# Patient Record
Sex: Male | Born: 1980 | Race: White | Hispanic: No | Marital: Single | Smoking: Current every day smoker
Health system: Southern US, Community
[De-identification: ages and names within clinical notes are randomized; demographics above are authoritative.]

---

## 2001-07-05 ENCOUNTER — Inpatient Hospital Stay (HOSPITAL_COMMUNITY): Admission: EM | Admit: 2001-07-05 | Discharge: 2001-07-07 | Payer: Self-pay | Admitting: Emergency Medicine

## 2001-07-05 ENCOUNTER — Encounter: Payer: Self-pay | Admitting: Emergency Medicine

## 2003-02-05 ENCOUNTER — Emergency Department (HOSPITAL_COMMUNITY): Admission: EM | Admit: 2003-02-05 | Discharge: 2003-02-05 | Payer: Self-pay | Admitting: Emergency Medicine

## 2005-08-12 ENCOUNTER — Emergency Department (HOSPITAL_COMMUNITY): Admission: EM | Admit: 2005-08-12 | Discharge: 2005-08-12 | Payer: Self-pay | Admitting: Emergency Medicine

## 2005-08-16 ENCOUNTER — Emergency Department (HOSPITAL_COMMUNITY): Admission: EM | Admit: 2005-08-16 | Discharge: 2005-08-16 | Payer: Self-pay | Admitting: Family Medicine

## 2005-08-19 ENCOUNTER — Emergency Department (HOSPITAL_COMMUNITY): Admission: EM | Admit: 2005-08-19 | Discharge: 2005-08-19 | Payer: Self-pay | Admitting: Family Medicine

## 2005-08-20 ENCOUNTER — Emergency Department (HOSPITAL_COMMUNITY): Admission: AD | Admit: 2005-08-20 | Discharge: 2005-08-20 | Payer: Self-pay | Admitting: Family Medicine

## 2005-08-22 ENCOUNTER — Emergency Department (HOSPITAL_COMMUNITY): Admission: EM | Admit: 2005-08-22 | Discharge: 2005-08-22 | Payer: Self-pay | Admitting: Emergency Medicine

## 2007-08-20 ENCOUNTER — Emergency Department (HOSPITAL_COMMUNITY): Admission: EM | Admit: 2007-08-20 | Discharge: 2007-08-21 | Payer: Self-pay | Admitting: Emergency Medicine

## 2008-01-08 ENCOUNTER — Emergency Department (HOSPITAL_COMMUNITY): Admission: EM | Admit: 2008-01-08 | Discharge: 2008-01-08 | Payer: Self-pay | Admitting: Emergency Medicine

## 2010-01-29 ENCOUNTER — Emergency Department (HOSPITAL_COMMUNITY): Admission: EM | Admit: 2010-01-29 | Discharge: 2010-01-30 | Payer: Self-pay | Admitting: Emergency Medicine

## 2010-01-30 ENCOUNTER — Emergency Department (HOSPITAL_COMMUNITY): Admission: EM | Admit: 2010-01-30 | Discharge: 2010-01-30 | Payer: Self-pay | Admitting: Emergency Medicine

## 2011-03-01 ENCOUNTER — Inpatient Hospital Stay (INDEPENDENT_AMBULATORY_CARE_PROVIDER_SITE_OTHER)
Admission: RE | Admit: 2011-03-01 | Discharge: 2011-03-01 | Disposition: A | Discharge status: Home / Self Care | Payer: PRIVATE HEALTH INSURANCE | Source: Ambulatory Visit | Attending: Family Medicine | Admitting: Family Medicine

## 2011-03-01 DIAGNOSIS — J069 Acute upper respiratory infection, unspecified: Secondary | ICD-10-CM

## 2011-03-08 LAB — DIFFERENTIAL
Eosinophils Relative: 0 % (ref 0–5)
Lymphocytes Relative: 22 % (ref 12–46)
Lymphs Abs: 3.2 10*3/uL (ref 0.7–4.0)

## 2011-03-08 LAB — POCT CARDIAC MARKERS
Myoglobin, poc: 20.7 ng/mL (ref 12–200)
Troponin i, poc: <0.05 ng/mL (ref 0.00–0.09)

## 2011-03-08 LAB — D-DIMER, QUANTITATIVE: D-Dimer, Quant: <0.22 ug/mL-FEU (ref 0.00–0.48)

## 2011-03-08 LAB — CBC
HCT: 46.3 % (ref 39.0–52.0)
Platelets: 238 10*3/uL (ref 150–400)
WBC: 14.5 10*3/uL — ABNORMAL HIGH (ref 4.0–10.5)

## 2011-03-08 LAB — POCT I-STAT, CHEM 8
BUN: 8 mg/dL (ref 6–23)
Chloride: 106 mEq/L (ref 96–112)
Sodium: 140 mEq/L (ref 135–145)

## 2011-05-05 NOTE — H&P (Signed)
[facility]  Patient:    Joseph Jefferson, Joseph Jefferson                     MRN: [id number] Adm. Date:  [id number] Attending:  Benny Lennert                         History and Physical  CHIEF COMPLAINT:  Patient comes in with a chief complaint of abdominal pain.  HISTORY OF PRESENT ILLNESS:  This 30 year old male, with no significant past medical history, presented to the ER after one day of left lower quadrant abdominal pain, nausea and vomiting.  Patient states that he had been in decent health prior to his evaluation with the exception of some back pain that started one day prior.  His symptoms began approximately 10 a.m. and have persisted for the past 21 hours.  Patient has felt subjective fever and chills.  Patient denies any mitigating or exacerbating factors.  Patient has never had problems like this before.  PAST MEDICAL HISTORY:  None.  MEDICATIONS:  None.  ALLERGIES:  None.  FAMILY HISTORY:  Both parents are alive and well with no medical problems.  SOCIAL HISTORY:  Patient has a live-in girlfriend; they have two children. Patient does smoke approximately a pack of cigarettes per day and rarely drinks alcohol.  Patient denies any drugs.  REVIEW OF SYSTEMS:  Patient denies any headaches, visual changes.  Patient denies any chest pain or respiratory problems.  Patient has the above-mentioned abdominal pain, particularly in the left lower quadrant. Patient denies any bowel or bladder changes.  Patient denies any numbness or weakness.  PHYSICAL EXAMINATION:  VITAL SIGNS:  Temperature 101, pulse 109, blood pressure 98/51, respiratory rate 20.  GENERAL:  A young man in mild discomfort lying on a gurney.  HEENT:  Pupils are equal, round and reactive to light.  No papilledema.  TMs clear.  HEART:  Regular rhythm.  No murmurs, rubs, or gallops.  LUNGS:  Clear.  No rales.  No wheezes.  ABDOMEN:  Soft.  No rebound.  No guarding.  No  hepatosplenomegaly.  Positive bowel sounds.  Mild left lower quadrant tenderness.  EXTREMITIES:  No cyanosis, clubbing or edema.  LABORATORY DATA:  Comprehensive metabolic panel is within normal limits.  Chest x-ray normal.  CT of the abdomen showed trace pelvic fluid with question of minimal sigmoid diverticulosis with a normal appendix.  No abscesses.  WBC 16.4 with neutrophil of 84% and 12% monocytes.  Urinalysis is normal.  ASSESSMENT AND PLAN:  Abdominal pain and fever.  It is difficult to say what exactly is going on in [name] case.  Patient seems very young to have any diverticular disease, though there is some question of that on the CT scan.  Patient does not appear to have appendicitis on the CT scan; however, patient is still uncomfortable with some mild nausea and pain.  Patient was given a dose of Rocephin in the emergency room.  I will change this to Cipro and Flagyl.  It seems that patient should be able to tolerate p.o.s and will try him on a clear liquid diet for now.  If symptoms worsen or persist, then I think surgical evaluation will be warranted at that time, however, I think at this point the patient is stable.  This may simply be a very bad viral gastroenterology; however, given the white count with a left shift, it seems that there may  truly be a bacterial infectious cause.  I will follow closely. D:  07/05/01 TD:  07/05/01 Job: [id number]

## 2011-09-08 LAB — URINALYSIS, ROUTINE W REFLEX MICROSCOPIC
Bilirubin Urine: NEGATIVE
Glucose, UA: NEGATIVE
Hgb urine dipstick: NEGATIVE
Ketones, ur: NEGATIVE
Nitrite: NEGATIVE
Protein, ur: NEGATIVE
Specific Gravity, Urine: 1.013
Urobilinogen, UA: 0.2
pH: 6

## 2011-09-08 LAB — I-STAT 8, (EC8 V) (CONVERTED LAB)
Bicarbonate: 26.6 — ABNORMAL HIGH
Glucose, Bld: 89
Potassium: 3.8
TCO2: 28
pH, Ven: 7.372 — ABNORMAL HIGH

## 2011-09-08 LAB — DIFFERENTIAL
Basophils Absolute: 0
Basophils Relative: 0
Eosinophils Absolute: 0
Eosinophils Relative: 0
Lymphocytes Relative: 5 — ABNORMAL LOW
Lymphs Abs: 0.5 — ABNORMAL LOW
Monocytes Absolute: 1.3 — ABNORMAL HIGH
Monocytes Relative: 14 — ABNORMAL HIGH
Neutro Abs: 7.6
Neutrophils Relative %: 81 — ABNORMAL HIGH

## 2011-09-08 LAB — CBC
HCT: 47.5
Hemoglobin: 16.4
MCHC: 34.5
MCV: 94.7
Platelets: 183
RBC: 5.02
RDW: 12.8
WBC: 9.5

## 2011-09-08 LAB — POCT I-STAT CREATININE: Operator id: 146091

## 2011-09-08 LAB — INFLUENZA A+B VIRUS AG-DIRECT(RAPID)
Inflenza A Ag: POSITIVE — AB
Influenza B Ag: NEGATIVE

## 2011-09-08 LAB — CK: Total CK: 132

## 2012-01-22 ENCOUNTER — Encounter (HOSPITAL_COMMUNITY): Payer: Self-pay | Admitting: *Deleted

## 2012-01-22 ENCOUNTER — Emergency Department (HOSPITAL_COMMUNITY)
Admission: EM | Admit: 2012-01-22 | Discharge: 2012-01-23 | Disposition: A | Discharge status: Home / Self Care | Payer: Self-pay | Attending: Emergency Medicine | Admitting: Emergency Medicine

## 2012-01-22 ENCOUNTER — Emergency Department (HOSPITAL_COMMUNITY): Payer: Self-pay

## 2012-01-22 DIAGNOSIS — Z7729 Contact with and (suspected ) exposure to other hazardous substances: Secondary | ICD-10-CM | POA: Insufficient documentation

## 2012-01-22 DIAGNOSIS — R509 Fever, unspecified: Secondary | ICD-10-CM | POA: Insufficient documentation

## 2012-01-22 DIAGNOSIS — R0602 Shortness of breath: Secondary | ICD-10-CM | POA: Insufficient documentation

## 2012-01-22 DIAGNOSIS — R05 Cough: Secondary | ICD-10-CM

## 2012-01-22 DIAGNOSIS — R059 Cough, unspecified: Secondary | ICD-10-CM | POA: Insufficient documentation

## 2012-01-22 DIAGNOSIS — R079 Chest pain, unspecified: Secondary | ICD-10-CM | POA: Insufficient documentation

## 2012-01-22 MED ORDER — HYDROCODONE-HOMATROPINE 5-1.5 MG/5ML PO SYRP: 5.0000 mL | ORAL_SOLUTION | Freq: Four times a day (QID) | ORAL | Status: AC | PRN
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
## 2012-01-23 NOTE — ED Provider Notes (Signed)
Medical screening examination/treatment/procedure(s) were performed by non-physician practitioner and as supervising physician I was immediately available for consultation/collaboration.   Dayton Bailiff, MD 01/23/12 1450

## 2012-03-12 ENCOUNTER — Emergency Department (HOSPITAL_COMMUNITY)
Admission: EM | Admit: 2012-03-12 | Discharge: 2012-03-12 | Disposition: A | Discharge status: Home / Self Care | Payer: Self-pay | Attending: Emergency Medicine | Admitting: Emergency Medicine

## 2012-03-12 ENCOUNTER — Encounter (HOSPITAL_COMMUNITY): Payer: Self-pay | Admitting: *Deleted

## 2012-03-12 DIAGNOSIS — R197 Diarrhea, unspecified: Secondary | ICD-10-CM | POA: Insufficient documentation

## 2012-03-12 DIAGNOSIS — IMO0001 Reserved for inherently not codable concepts without codable children: Secondary | ICD-10-CM | POA: Insufficient documentation

## 2012-03-12 DIAGNOSIS — R112 Nausea with vomiting, unspecified: Secondary | ICD-10-CM | POA: Insufficient documentation

## 2012-03-12 DIAGNOSIS — R51 Headache: Secondary | ICD-10-CM | POA: Insufficient documentation

## 2012-03-12 DIAGNOSIS — J111 Influenza due to unidentified influenza virus with other respiratory manifestations: Secondary | ICD-10-CM | POA: Insufficient documentation

## 2012-03-12 DIAGNOSIS — R509 Fever, unspecified: Secondary | ICD-10-CM | POA: Insufficient documentation

## 2012-03-12 MED ORDER — KETOROLAC TROMETHAMINE 30 MG/ML IJ SOLN
30.0000 mg | Freq: Once | INTRAMUSCULAR | Status: AC
  Administered 2012-03-12: 30 mg via INTRAVENOUS
  Filled 2012-03-12: qty 1

## 2012-03-12 MED ORDER — SODIUM CHLORIDE 0.9 % IV BOLUS (SEPSIS)
1000.0000 mL | Freq: Once | INTRAVENOUS | Status: AC
  Administered 2012-03-12: 1000 mL via INTRAVENOUS

## 2012-03-12 MED ORDER — ONDANSETRON HCL 4 MG/2ML IJ SOLN
4.0000 mg | Freq: Once | INTRAMUSCULAR | Status: AC
  Administered 2012-03-12: 4 mg via INTRAVENOUS
  Filled 2012-03-12: qty 2

## 2012-03-12 MED ORDER — PROMETHAZINE HCL 25 MG PO TABS: 25.0000 mg | ORAL_TABLET | Freq: Four times a day (QID) | ORAL | Status: DC | PRN

## 2012-03-12 NOTE — Discharge Instructions (Signed)
Please try to stay hydrated.  She be given a prescription for Phenergan for nausea and vomiting.  Take this with a sip of water and weight 30-45 minutes before attempting to eat or drink.

## 2012-03-12 NOTE — ED Notes (Signed)
Pt states "started feeling bad on Saturday, have nausea, vomiting, diarrhea & hurting all over"

## 2012-03-12 NOTE — ED Provider Notes (Signed)
History     CSN: [id number]  Arrival date & time 03/12/12  1645   First MD Initiated Contact with Patient 03/12/12 1850      Chief Complaint  Patient presents with  . Generalized Body Aches  . Fever  . Nausea  . Emesis  . Diarrhea    (Consider location/radiation/quality/duration/timing/severity/associated sxs/prior treatment) HPI Comments: Tissue with 3 days of myalgias, fever, headache, nausea or vomiting, diarrhea.   Patient is a 31 y.o. male presenting with fever, vomiting, and diarrhea. The history is provided by the patient.  Fever Primary symptoms of the febrile illness include fever, fatigue, headaches, cough, nausea, vomiting, diarrhea and myalgias. Primary symptoms do not include wheezing, shortness of breath or rash. The current episode started 3 to 5 days ago. This is a new problem. The problem has not changed since onset. The headache is not associated with neck stiffness.  Emesis  Associated symptoms include cough, diarrhea, a fever, headaches and myalgias.  Diarrhea The primary symptoms include fever, fatigue, nausea, vomiting, diarrhea and myalgias. Primary symptoms do not include rash.    History reviewed. No pertinent past medical history.  History reviewed. No pertinent past surgical history.  No family history on file.  History  Substance Use Topics  . Smoking status: Former Smoker    Types: Cigarettes    Quit date: 02/13/2012  . Smokeless tobacco: Not on file  . Alcohol Use: Yes     rarely      Review of Systems  Constitutional: Positive for fever and fatigue.  HENT: Positive for rhinorrhea and neck pain. Negative for neck stiffness.   Respiratory: Positive for cough. Negative for shortness of breath and wheezing.   Gastrointestinal: Positive for nausea, vomiting and diarrhea.  Musculoskeletal: Positive for myalgias.  Skin: Negative for rash and wound.  Neurological: Positive for headaches.    Allergies  Review of patient's allergies  indicates no known allergies.  Home Medications   Current Outpatient Rx  Name Route Sig Dispense Refill  . ACETAMINOPHEN 500 MG PO TABS Oral Take 500 mg by mouth every 6 (six) hours as needed. pain    . DAYQUIL MULTI-SYMPTOM COLD/FLU PO Oral Take 2 capsules by mouth daily as needed. Cold symptons    . PROMETHAZINE HCL 25 MG PO TABS Oral Take 1 tablet (25 mg total) by mouth every 6 (six) hours as needed for nausea. 10 tablet 0    BP 117/79  Pulse 79  Temp(Src) 100 F (37.8 C) (Oral)  Resp 19  SpO2 97%  Physical Exam  Constitutional: He is oriented to person, place, and time. He appears well-developed and well-nourished.  Eyes: Pupils are equal, round, and reactive to light.  Neck: Normal range of motion.  Cardiovascular: Normal rate.   Pulmonary/Chest: Effort normal.  Abdominal: Soft. Bowel sounds are normal.  Musculoskeletal: Normal range of motion. He exhibits no tenderness.  Neurological: He is oriented to person, place, and time.  Skin: Skin is warm. No rash noted. There is pallor.    ED Course  Procedures (including critical care time)  Labs Reviewed - No data to display No results found.   1. Influenza       MDM  Patient feeling marginally better after IV fluids is able to tolerate by mouth.  Since his is the end of day 3 of his flulike syndrome.  I think it is too late to start antivirals encourage him to continue hydration by mouth as well as alternating doses of Tylenol and ibuprofen  and rest        Arman Filter, NP 03/13/12 1500

## 2012-03-13 NOTE — ED Provider Notes (Signed)
Medical screening examination/treatment/procedure(s) were performed by non-physician practitioner and as supervising physician I was immediately available for consultation/collaboration.  Sylvester Minton, MD 03/13/12 1548 

## 2014-01-13 ENCOUNTER — Encounter (HOSPITAL_COMMUNITY): Payer: Self-pay | Admitting: Emergency Medicine

## 2014-01-13 ENCOUNTER — Emergency Department (HOSPITAL_COMMUNITY)
Admission: EM | Admit: 2014-01-13 | Discharge: 2014-01-13 | Disposition: A | Discharge status: Home / Self Care | Payer: Self-pay | Attending: Emergency Medicine | Admitting: Emergency Medicine

## 2014-01-13 ENCOUNTER — Emergency Department (HOSPITAL_COMMUNITY): Payer: Self-pay

## 2014-01-13 DIAGNOSIS — Z791 Long term (current) use of non-steroidal anti-inflammatories (NSAID): Secondary | ICD-10-CM | POA: Insufficient documentation

## 2014-01-13 DIAGNOSIS — R197 Diarrhea, unspecified: Secondary | ICD-10-CM | POA: Insufficient documentation

## 2014-01-13 DIAGNOSIS — K5900 Constipation, unspecified: Secondary | ICD-10-CM | POA: Insufficient documentation

## 2014-01-13 DIAGNOSIS — J111 Influenza due to unidentified influenza virus with other respiratory manifestations: Secondary | ICD-10-CM | POA: Insufficient documentation

## 2014-01-13 LAB — COMPREHENSIVE METABOLIC PANEL
ALT: 16 U/L (ref 0–53)
AST: 20 U/L (ref 0–37)
Albumin: 4 g/dL (ref 3.5–5.2)
Alkaline Phosphatase: 82 U/L (ref 39–117)
BILIRUBIN TOTAL: 0.4 mg/dL (ref 0.3–1.2)
BUN: 9 mg/dL (ref 6–23)
CHLORIDE: 97 meq/L (ref 96–112)
CO2: 23 meq/L (ref 19–32)
CREATININE: 0.77 mg/dL (ref 0.50–1.35)
Calcium: 9.2 mg/dL (ref 8.4–10.5)
GLUCOSE: 123 mg/dL — AB (ref 70–99)
Potassium: 3.6 mEq/L — ABNORMAL LOW (ref 3.7–5.3)
Sodium: 136 mEq/L — ABNORMAL LOW (ref 137–147)
Total Protein: 7.7 g/dL (ref 6.0–8.3)

## 2014-01-13 LAB — CBC WITH DIFFERENTIAL/PLATELET
Basophils Absolute: 0 10*3/uL (ref 0.0–0.1)
Basophils Relative: 0 % (ref 0–1)
Eosinophils Absolute: 0 10*3/uL (ref 0.0–0.7)
Eosinophils Relative: 0 % (ref 0–5)
HEMATOCRIT: 45.7 % (ref 39.0–52.0)
HEMOGLOBIN: 16.5 g/dL (ref 13.0–17.0)
LYMPHS ABS: 1.3 10*3/uL (ref 0.7–4.0)
LYMPHS PCT: 9 % — AB (ref 12–46)
MCH: 32.2 pg (ref 26.0–34.0)
MCHC: 36.1 g/dL — ABNORMAL HIGH (ref 30.0–36.0)
MCV: 89.3 fL (ref 78.0–100.0)
MONO ABS: 1.6 10*3/uL — AB (ref 0.1–1.0)
MONOS PCT: 11 % (ref 3–12)
NEUTROS ABS: 11.6 10*3/uL — AB (ref 1.7–7.7)
Neutrophils Relative %: 81 % — ABNORMAL HIGH (ref 43–77)
Platelets: 160 10*3/uL (ref 150–400)
RBC: 5.12 MIL/uL (ref 4.22–5.81)
RDW: 12.9 % (ref 11.5–15.5)
WBC: 14.4 10*3/uL — AB (ref 4.0–10.5)

## 2014-01-13 LAB — LIPASE, BLOOD: LIPASE: 16 U/L (ref 11–59)

## 2014-01-13 MED ORDER — ONDANSETRON 4 MG PO TBDP: 4.0000 mg | ORAL_TABLET | Freq: Three times a day (TID) | ORAL | Status: DC | PRN

## 2014-01-13 MED ORDER — KETOROLAC TROMETHAMINE 30 MG/ML IJ SOLN
30.0000 mg | Freq: Once | INTRAMUSCULAR | Status: AC
  Administered 2014-01-13: 30 mg via INTRAVENOUS
  Filled 2014-01-13: qty 1

## 2014-01-13 MED ORDER — NAPROXEN 500 MG PO TABS: 500.0000 mg | ORAL_TABLET | Freq: Two times a day (BID) | ORAL | Status: DC

## 2014-01-13 MED ORDER — ONDANSETRON HCL 4 MG/2ML IJ SOLN
4.0000 mg | Freq: Once | INTRAMUSCULAR | Status: AC
  Administered 2014-01-13: 4 mg via INTRAVENOUS
  Filled 2014-01-13: qty 2

## 2014-01-13 MED ORDER — MORPHINE SULFATE 4 MG/ML IJ SOLN
4.0000 mg | INTRAMUSCULAR | Status: DC | PRN
  Administered 2014-01-13: 4 mg via INTRAVENOUS
  Filled 2014-01-13: qty 1

## 2014-01-13 MED ORDER — HYDROCOD POLST-CHLORPHEN POLST 10-8 MG/5ML PO LQCR: 5.0000 mL | Freq: Two times a day (BID) | ORAL | Status: DC

## 2014-01-13 MED ORDER — SODIUM CHLORIDE 0.9 % IV BOLUS (SEPSIS)
2000.0000 mL | Freq: Once | INTRAVENOUS | Status: AC
  Administered 2014-01-13: 2000 mL via INTRAVENOUS

## 2014-01-13 NOTE — Discharge Instructions (Signed)

## 2014-01-13 NOTE — ED Notes (Signed)
Fever; diarrhea; loss of appetite; headache; symptoms since Saturday; getting worse per family

## 2014-01-13 NOTE — ED Provider Notes (Signed)
CSN: [id number]     Arrival date & time 01/13/14  [id number] History   First MD Initiated Contact with Patient 01/13/14 [phone]     Chief Complaint  Patient presents with  . Fever  . Generalized Body Aches    HPI  Presents with a three-day illness. Symptoms started Friday night or Saturday. Bodyaches. Intermittent fevers up to 11. Headache. No neck pain or stiffness. No rash. Frequent cough. No dyspnea shortness of breath. Diarrhea starting this morning, however no nausea or vomiting. He states that he thought he got the diarrhea because he is drinking so much Gatorade. Diffuse bodyaches. No urinary symptoms. No dark urine.  History reviewed. No pertinent past medical history. History reviewed. No pertinent past surgical history. No family history on file. History  Substance Use Topics  . Smoking status: Former Smoker    Types: Cigarettes    Quit date: 02/13/2012  . Smokeless tobacco: Not on file  . Alcohol Use: Yes     Comment: rarely    Review of Systems  Constitutional: Positive for fever and chills. Negative for diaphoresis, appetite change and fatigue.  HENT: Negative for mouth sores, sore throat and trouble swallowing.   Eyes: Negative for visual disturbance.  Respiratory: Positive for cough. Negative for chest tightness, shortness of breath and wheezing.   Cardiovascular: Negative for chest pain.  Gastrointestinal: Positive for constipation. Negative for nausea, vomiting, abdominal pain, diarrhea and abdominal distention.  Endocrine: Negative for polydipsia, polyphagia and polyuria.  Genitourinary: Negative for dysuria, frequency and hematuria.  Musculoskeletal: Positive for myalgias. Negative for gait problem.  Skin: Negative for color change, pallor and rash.  Neurological: Negative for dizziness, syncope, light-headedness and headaches.  Hematological: Does not bruise/bleed easily.  Psychiatric/Behavioral: Negative for behavioral problems and confusion.    Allergies  Review  of patient's allergies indicates no known allergies.  Home Medications   Current Outpatient Rx  Name  Route  Sig  Dispense  Refill  . acetaminophen (TYLENOL) 500 MG tablet   Oral   Take 500 mg by mouth every 6 (six) hours as needed. pain         . aspirin-sod bicarb-citric acid (ALKA-SELTZER) 325 MG TBEF tablet   Oral   Take 325 mg by mouth every 6 (six) hours as needed.         Marland Kitchen. HYDROcodone-acetaminophen (NORCO/VICODIN) 5-325 MG per tablet   Oral   Take 1 tablet by mouth every 6 (six) hours as needed for moderate pain.         Marland Kitchen. ibuprofen (ADVIL,MOTRIN) 200 MG tablet   Oral   Take 400 mg by mouth every 6 (six) hours as needed.         . chlorpheniramine-HYDROcodone (TUSSIONEX PENNKINETIC ER) 10-8 MG/5ML LQCR   Oral   Take 5 mLs by mouth every 12 (twelve) hours.   60 mL   0   . naproxen (NAPROSYN) 500 MG tablet   Oral   Take 1 tablet (500 mg total) by mouth 2 (two) times daily.   30 tablet   0   . ondansetron (ZOFRAN ODT) 4 MG disintegrating tablet   Oral   Take 1 tablet (4 mg total) by mouth every 8 (eight) hours as needed for nausea.   6 tablet   0    BP 102/70  Pulse 110  Temp(Src) 98.7 F (37.1 C) (Oral)  Resp 18  SpO2 96% Physical Exam  Constitutional: He is oriented to person, place, and time. He appears well-developed and  well-nourished. No distress.  HENT:  Head: Normocephalic.  No pharyngitis. Normal TMs. No sinus findings. Nontender over the maxilla. No nasal congestion.  Eyes: Conjunctivae are normal. Pupils are equal, round, and reactive to light. No scleral icterus.  Neck: Normal range of motion. Neck supple. No thyromegaly present.  Cardiovascular: Normal rate and regular rhythm.  Exam reveals no gallop and no friction rub.   No murmur heard. Pulmonary/Chest: Effort normal. No respiratory distress.  Clear lungs. No focal diminished breath sounds. No increased work of breathing  Abdominal: Soft. Bowel sounds are normal. He exhibits no  distension. There is no tenderness. There is no rebound.  Musculoskeletal: Normal range of motion.  Neurological: He is alert and oriented to person, place, and time.  Skin: Skin is warm and dry. No rash noted.  Psychiatric: He has a normal mood and affect. His behavior is normal.    ED Course  Procedures (including critical care time) Labs Review Labs Reviewed  CBC WITH DIFFERENTIAL - Abnormal; Notable for the following:    WBC 14.4 (*)    MCHC 36.1 (*)    Neutrophils Relative % 81 (*)    Neutro Abs 11.6 (*)    Lymphocytes Relative 9 (*)    Monocytes Absolute 1.6 (*)    All other components within normal limits  COMPREHENSIVE METABOLIC PANEL - Abnormal; Notable for the following:    Sodium 136 (*)    Potassium 3.6 (*)    Glucose, Bld 123 (*)    All other components within normal limits  LIPASE, BLOOD   Imaging Review Dg Chest 2 View  01/13/2014   CLINICAL DATA:  Fever, cough.  EXAM: CHEST  2 VIEW  COMPARISON:  Chest radiograph 01/21/2013  FINDINGS: Stable cardiac and mediastinal contours. No consolidative pulmonary opacities. No pleural effusion or pneumothorax. Regional skeleton is unremarkable.  IMPRESSION: No acute cardiopulmonary process.   Electronically Signed   By: Annia Belt M.D.   On: 01/13/2014 12:20    EKG Interpretation   None       MDM   1. Influenza    Patient was feeling better after IV hydration, antiemetics, and pain medication.  X-ray showed no pneumonia. He is well oxygenated. He is afebrile. Is not tachycardic. Plan will be symptomatically at home. He is outside the window for antiviral therapy.    Rolland Porter, MD 01/13/14 [phone]

## 2014-01-13 NOTE — ED Notes (Signed)
Bed: WA18 Expected date:  Expected time:  Means of arrival:  Comments: EMS 

## 2014-11-10 ENCOUNTER — Emergency Department (HOSPITAL_COMMUNITY): Payer: Self-pay

## 2014-11-10 ENCOUNTER — Encounter (HOSPITAL_COMMUNITY): Payer: Self-pay

## 2014-11-10 ENCOUNTER — Emergency Department (HOSPITAL_COMMUNITY)
Admission: EM | Admit: 2014-11-10 | Discharge: 2014-11-10 | Disposition: A | Discharge status: Home / Self Care | Payer: Self-pay | Attending: Emergency Medicine | Admitting: Emergency Medicine

## 2014-11-10 DIAGNOSIS — R109 Unspecified abdominal pain: Secondary | ICD-10-CM

## 2014-11-10 DIAGNOSIS — N200 Calculus of kidney: Secondary | ICD-10-CM | POA: Insufficient documentation

## 2014-11-10 DIAGNOSIS — Z87891 Personal history of nicotine dependence: Secondary | ICD-10-CM | POA: Insufficient documentation

## 2014-11-10 DIAGNOSIS — Z79899 Other long term (current) drug therapy: Secondary | ICD-10-CM | POA: Insufficient documentation

## 2014-11-10 LAB — URINALYSIS, ROUTINE W REFLEX MICROSCOPIC
Bilirubin Urine: NEGATIVE
GLUCOSE, UA: NEGATIVE mg/dL
KETONES UR: 15 mg/dL — AB
LEUKOCYTES UA: NEGATIVE
Nitrite: NEGATIVE
Protein, ur: NEGATIVE mg/dL
Specific Gravity, Urine: 1.014 (ref 1.005–1.030)
UROBILINOGEN UA: 0.2 mg/dL (ref 0.0–1.0)
pH: 7 (ref 5.0–8.0)

## 2014-11-10 LAB — URINE MICROSCOPIC-ADD ON

## 2014-11-10 MED ORDER — OXYCODONE-ACETAMINOPHEN 5-325 MG PO TABS: 1.0000 | ORAL_TABLET | ORAL | Status: DC | PRN

## 2014-11-10 MED ORDER — PROMETHAZINE HCL 25 MG PO TABS: 25.0000 mg | ORAL_TABLET | Freq: Four times a day (QID) | ORAL | Status: DC | PRN

## 2014-11-10 MED ORDER — HYDROMORPHONE HCL 1 MG/ML IJ SOLN
1.0000 mg | Freq: Once | INTRAMUSCULAR | Status: AC
Start: 2014-11-10 — End: 2014-11-10
  Administered 2014-11-10: 1 mg via INTRAVENOUS
  Filled 2014-11-10: qty 1

## 2014-11-10 MED ORDER — KETOROLAC TROMETHAMINE 30 MG/ML IJ SOLN
30.0000 mg | Freq: Once | INTRAMUSCULAR | Status: AC
  Administered 2014-11-10: 30 mg via INTRAVENOUS
  Filled 2014-11-10: qty 1

## 2014-11-10 MED ORDER — ONDANSETRON HCL 4 MG/2ML IJ SOLN
4.0000 mg | Freq: Once | INTRAMUSCULAR | Status: AC
  Administered 2014-11-10: 4 mg via INTRAVENOUS
  Filled 2014-11-10: qty 2

## 2014-11-10 MED ORDER — IBUPROFEN 800 MG PO TABS: 800.0000 mg | ORAL_TABLET | Freq: Three times a day (TID) | ORAL | Status: DC

## 2014-11-10 NOTE — ED Notes (Signed)
Pt states he was at work.  Sudden onset rt lower quadrant abdominal pain radiating to back. HX of stones but does not feel same.  No n/v/d.  No prior illness to event.  No fever.  No change in urination.

## 2014-11-10 NOTE — ED Provider Notes (Signed)
CSN: [id number]     Arrival date & time 11/10/14  1630 History   First MD Initiated Contact with Patient 11/10/14 1647     Chief Complaint  Patient presents with  . Abdominal Pain     (Consider location/radiation/quality/duration/timing/severity/associated sxs/prior Treatment) HPI Comments: 33 year old male, history of kidney stone reports sudden onset of right flank pain that started 5-1/2 hours prior to arrival which is intermittent, sharp and stabbing, radiates to the right groin, not associated with dysuria hematuria or urethral discharge. He does have associated nausea, he denies any change with position, has no fevers chills, has no rashes, symptoms are severe at worst. No medications given prior to arrival.  Patient is a 33 y.o. male presenting with abdominal pain. The history is provided by the patient.  Abdominal Pain   History reviewed. No pertinent past medical history. History reviewed. No pertinent past surgical history. History reviewed. No pertinent family history. History  Substance Use Topics  . Smoking status: Former Smoker    Types: Cigarettes    Quit date: 02/13/2012  . Smokeless tobacco: Not on file  . Alcohol Use: Yes     Comment: rarely    Review of Systems  Gastrointestinal: Positive for abdominal pain.  All other systems reviewed and are negative.     Allergies  Poison ivy treatments  Home Medications   Prior to Admission medications   Medication Sig Start Date End Date Taking? Authorizing Provider  chlorpheniramine-HYDROcodone (TUSSIONEX PENNKINETIC ER) 10-8 MG/5ML LQCR Take 5 mLs by mouth every 12 (twelve) hours. Patient not taking: Reported on 11/10/2014 01/13/14   Rolland Porter, MD  ibuprofen (ADVIL,MOTRIN) 800 MG tablet Take 1 tablet (800 mg total) by mouth 3 (three) times daily. 11/10/14   Vida Roller, MD  naproxen (NAPROSYN) 500 MG tablet Take 1 tablet (500 mg total) by mouth 2 (two) times daily. Patient not taking: Reported on 11/10/2014  01/13/14   Rolland Porter, MD  naproxen (NAPROSYN) 500 MG tablet Take 1 tablet (500 mg total) by mouth 2 (two) times daily. Patient not taking: Reported on 11/10/2014 01/13/14   Rolland Porter, MD  ondansetron (ZOFRAN ODT) 4 MG disintegrating tablet Take 1 tablet (4 mg total) by mouth every 8 (eight) hours as needed for nausea. Patient not taking: Reported on 11/10/2014 01/13/14   Rolland Porter, MD  ondansetron (ZOFRAN ODT) 4 MG disintegrating tablet Take 1 tablet (4 mg total) by mouth every 8 (eight) hours as needed for nausea. Patient not taking: Reported on 11/10/2014 01/13/14   Rolland Porter, MD  oxyCODONE-acetaminophen (PERCOCET) 5-325 MG per tablet Take 1 tablet by mouth every 4 (four) hours as needed. 11/10/14   Vida Roller, MD  promethazine (PHENERGAN) 25 MG tablet Take 1 tablet (25 mg total) by mouth every 6 (six) hours as needed for nausea. 03/12/12 03/19/12  Arman Filter, NP  promethazine (PHENERGAN) 25 MG tablet Take 1 tablet (25 mg total) by mouth every 6 (six) hours as needed for nausea or vomiting. 11/10/14   Vida Roller, MD   BP 102/64 mmHg  Pulse 80  Temp(Src) 97.4 F (36.3 C) (Oral)  Resp 18  SpO2 100% Physical Exam  Constitutional: He appears well-developed and well-nourished.  HENT:  Head: Normocephalic and atraumatic.  Mouth/Throat: Oropharynx is clear and moist. No oropharyngeal exudate.  Eyes: Conjunctivae and EOM are normal. Pupils are equal, round, and reactive to light. Right eye exhibits no discharge. Left eye exhibits no discharge. No scleral icterus.  Neck: Normal range of  motion. Neck supple. No JVD present. No thyromegaly present.  Cardiovascular: Normal rate, regular rhythm, normal heart sounds and intact distal pulses.  Exam reveals no gallop and no friction rub.   No murmur heard. Pulmonary/Chest: Effort normal and breath sounds normal. No respiratory distress. He has no wheezes. He has no rales.  Abdominal: Soft. Bowel sounds are normal. He exhibits no distension and  no mass. There is no tenderness.  No abdominal tenderness to palpation  Musculoskeletal: Normal range of motion. He exhibits no edema or tenderness.  Right sided CVA tenderness,  Lymphadenopathy:    He has no cervical adenopathy.  Neurological: He is alert. Coordination normal.  Skin: Skin is warm and dry. No rash noted. No erythema.  Psychiatric: He has a normal mood and affect. His behavior is normal.  Nursing note and vitals reviewed.   ED Course  Procedures (including critical care time) Labs Review Labs Reviewed  URINALYSIS, ROUTINE W REFLEX MICROSCOPIC - Abnormal; Notable for the following:    Hgb urine dipstick MODERATE (*)    Ketones, ur 15 (*)    All other components within normal limits  URINE MICROSCOPIC-ADD ON    Imaging Review Ct Abdomen Pelvis Wo Contrast  11/10/2014   CLINICAL DATA:  Abdominal pain, right lower quadrant pain radiating to the back  EXAM: CT ABDOMEN AND PELVIS WITHOUT CONTRAST  TECHNIQUE: Multidetector CT imaging of the abdomen and pelvis was performed following the standard protocol without IV contrast.  COMPARISON:  None.  FINDINGS: Sagittal images of the spine shows bilateral pars defect at L5 level. Mild anterior spurring upper endplate of L3 and L4 vertebral body. Lung bases are unremarkable.  Unenhanced liver shows no biliary ductal dilatation. No calcified gallstones are noted within gallbladder.  Abdominal aorta is unremarkable.  Unenhanced pancreas spleen and adrenal glands are unremarkable. Unenhanced kidneys are symmetrical in size. No nephrolithiasis. No hydronephrosis or hydroureter. No calcified ureteral calculi are noted bilaterally. Multiple pelvic phleboliths.  There is a calcified calculus in left posterior aspect of urinary bladder measures 2 mm. Recent passed ureteral calculus cannot be excluded. Prostate gland and seminal vesicles are unremarkable.  Moderate stool noted in right colon and proximal transverse colon. No pericecal  inflammation. Normal appendix clearly visualized axial image 53. No small bowel obstruction. No ascites or free air. No adenopathy.  IMPRESSION: 1. No nephrolithiasis.  No hydronephrosis or hydroureter. 2. No calcified ureteral calculi. 3. There is 2 mm calcified calculus in left posterior aspect of urinary bladder. Recent passed ureteral calculus cannot be excluded. Clinical correlation is necessary. 4. Normal appendix. No pericecal inflammation. Moderate stool noted in right colon and proximal transverse colon. 5. No small bowel obstruction. 6. Bilateral pars defect at L5 level.   Electronically Signed   By: Natasha MeadLiviu  Pop M.D.   On: 11/10/2014 17:31      MDM   Final diagnoses:  Abdominal pain  Kidney stone    The patient has normal vital signs, he appears to have colicky-type pain consistent with kidney stone, bedside ultrasound shows no signs of hydronephrosis, CT scan pending, medications pending.  Emergency Focused Ultrasound Exam Limited retroperitoneal ultrasound of kidneys  Performed and interpreted by Dr. Hyacinth MeekerMiller Indication: flank pain Focused abdominal ultrasound with both kidneys imaged in transverse and longitudinal planes in real-time. Interpretation: No hydronephrosis visualized.   Images archived electronically  Urinalysis confirms hematuria, CT scan shows small kidney stone passed into the bladder, no residual stones or hydronephrosis, consistent with ultrasound. Patient has improved significantly after  medications given, stable for discharge.  Meds given in ED:  Medications  ketorolac (TORADOL) 30 MG/ML injection 30 mg (30 mg Intravenous Given 11/10/14 1703)  HYDROmorphone (DILAUDID) injection 1 mg (1 mg Intravenous Given 11/10/14 1703)  ondansetron (ZOFRAN) injection 4 mg (4 mg Intravenous Given 11/10/14 1703)    New Prescriptions   IBUPROFEN (ADVIL,MOTRIN) 800 MG TABLET    Take 1 tablet (800 mg total) by mouth 3 (three) times daily.   OXYCODONE-ACETAMINOPHEN  (PERCOCET) 5-325 MG PER TABLET    Take 1 tablet by mouth every 4 (four) hours as needed.   PROMETHAZINE (PHENERGAN) 25 MG TABLET    Take 1 tablet (25 mg total) by mouth every 6 (six) hours as needed for nausea or vomiting.      Vida RollerBrian D Jonuel Butterfield, MD 11/10/14 [phone]

## 2014-11-10 NOTE — Discharge Instructions (Signed)
Your exam and or your xrays have shown that you likely have a kidney stone.  You should follow up with the Urologist of your choosing or the Urologist listed above in the next 2-3 days if you have not passed the stone.  You should urinate in to the strainer until you pass the stone.    Flomax helps with passing the stone by opening up the Ureters (tubes), Vicodin and an antiinflammatory for pain if you are not allergic to these medicines.  Phenergan or Zofran for nausea.  Return to the ER for severe or worsening pain, vomiting or fevers or if you are unable to control your pain with the medicines provided.  Kidney Stones Kidney stones (ureteral lithiasis) are deposits that form inside your kidneys. The intense pain is caused by the stone moving through the urinary tract. When the stone moves, the ureter goes into spasm around the stone. The stone is usually passed in the urine.  CAUSES  A disorder that makes certain neck glands produce too much parathyroid hormone (primary hyperparathyroidism).  A buildup of uric acid crystals.  Narrowing (stricture) of the ureter.  A kidney obstruction present at birth (congenital obstruction).  Previous surgery on the kidney or ureters.  Numerous kidney infections.  SYMPTOMS  Feeling sick to your stomach (nauseous).  Throwing up (vomiting).  Blood in the urine (hematuria).  Pain that usually spreads (radiates) to the groin.  Frequency or urgency of urination.  DIAGNOSIS  Taking a history and physical exam.  Blood or urine tests.  Computerized X-ray scan (CT scan).  Occasionally, an examination of the inside of the urinary bladder (cystoscopy) is performed.  TREATMENT  Observation.  Increasing your fluid intake.  Surgery may be needed if you have severe pain or persistent obstruction.  The size, location, and chemical composition are all important variables that will determine the proper choice of action for you. Talk to your caregiver to better  understand your situation so that you will minimize the risk of injury to yourself and your kidney.  HOME CARE INSTRUCTIONS  Drink enough water and fluids to keep your urine clear or pale yellow.  Strain all urine through the provided strainer. Keep all particulate matter and stones for your caregiver to see. The stone causing the pain may be as small as a grain of salt. It is very important to use the strainer each and every time you pass your urine. The collection of your stone will allow your caregiver to analyze it and verify that a stone has actually passed.  Only take over-the-counter or prescription medicines for pain, discomfort, or fever as directed by your caregiver.  Make a follow-up appointment with your caregiver as directed.  Get follow-up X-rays if required. The absence of pain does not always mean that the stone has passed. It may have only stopped moving. If the urine remains completely obstructed, it can cause loss of kidney function or even complete destruction of the kidney. It is your responsibility to make sure X-rays and follow-ups are completed. Ultrasounds of the kidney can show blockages and the status of the kidney. Ultrasounds are not associated with any radiation and can be performed easily in a matter of minutes.  SEEK IMMEDIATE MEDICAL CARE IF:  Pain cannot be controlled with the prescribed medicine.  You have a fever.  The severity or intensity of pain increases over 18 hours and is not relieved by pain medicine.  You develop a new onset of abdominal pain.  You   feel faint or pass out.  MAKE SURE YOU:  Understand these instructions.  Will watch your condition.  Will get help right away if you are not doing well or get worse.  Document Released: 12/04/2005 Document Revised: 11/23/2011 Document Reviewed: 04/01/2010 [employer] Patient Information 2012 [employer].    

## 2015-08-23 ENCOUNTER — Encounter (HOSPITAL_COMMUNITY): Payer: Self-pay | Admitting: Vascular Surgery

## 2015-08-23 ENCOUNTER — Emergency Department (HOSPITAL_COMMUNITY)
Admission: EM | Admit: 2015-08-23 | Discharge: 2015-08-23 | Disposition: A | Discharge status: Home / Self Care | Payer: Self-pay | Attending: Emergency Medicine | Admitting: Emergency Medicine

## 2015-08-23 ENCOUNTER — Emergency Department (HOSPITAL_COMMUNITY): Payer: Self-pay

## 2015-08-23 DIAGNOSIS — Z87442 Personal history of urinary calculi: Secondary | ICD-10-CM | POA: Insufficient documentation

## 2015-08-23 DIAGNOSIS — Z79899 Other long term (current) drug therapy: Secondary | ICD-10-CM | POA: Insufficient documentation

## 2015-08-23 DIAGNOSIS — M5442 Lumbago with sciatica, left side: Secondary | ICD-10-CM | POA: Insufficient documentation

## 2015-08-23 DIAGNOSIS — Z720 Tobacco use: Secondary | ICD-10-CM | POA: Insufficient documentation

## 2015-08-23 MED ORDER — KETOROLAC TROMETHAMINE 60 MG/2ML IM SOLN
60.0000 mg | Freq: Once | INTRAMUSCULAR | Status: AC
  Administered 2015-08-23: 60 mg via INTRAMUSCULAR
  Filled 2015-08-23: qty 2

## 2015-08-23 MED ORDER — METHOCARBAMOL 500 MG PO TABS: 500.0000 mg | ORAL_TABLET | Freq: Two times a day (BID) | ORAL | Status: DC

## 2015-08-23 MED ORDER — NAPROXEN 500 MG PO TABS: 500.0000 mg | ORAL_TABLET | Freq: Two times a day (BID) | ORAL | Status: DC

## 2015-08-23 MED ORDER — DIAZEPAM 5 MG/ML IJ SOLN
5.0000 mg | Freq: Once | INTRAMUSCULAR | Status: AC
  Administered 2015-08-23: 5 mg via INTRAVENOUS
  Filled 2015-08-23: qty 2

## 2015-08-23 MED ORDER — HYDROCODONE-ACETAMINOPHEN 5-325 MG PO TABS: 2.0000 | ORAL_TABLET | ORAL | Status: DC | PRN

## 2015-08-23 NOTE — ED Notes (Signed)
Patient transported to X-ray 

## 2015-08-23 NOTE — ED Notes (Signed)
Pt reports to the ED for eval of mid to low back pain. The pain radiates into bilateral legs. The pain became very severe on Saturday. 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 Pt denies any numbness, tingling, paralysis, or bowel or bladder changes. Pt A&Ox4, resp e/u, and skin warm and dry.

## 2015-08-23 NOTE — ED Provider Notes (Signed)
CSN: [id number]     Arrival date & time 08/23/15  1615 History  This chart was scribed for Catha Gosselin, PA-C, working with Rolland Porter, MD by Chestine Spore, ED Scribe. The patient was seen in room TR11C/TR11C at 4:57 PM.    Chief Complaint  Patient presents with  . Back Pain      The history is provided by the patient. No language interpreter was used.    HPI Comments: Joseph Jefferson is a 34 y.o. male with a medical hx of kidney stones and scoliosis who presents to the Emergency Department complaining of gradual onset severe mid-low back pain onset 1 week ago worsening 3 days ago. Pt reports that his back pain is worsened with movement. Pt notes that he does a lot of heavy lifting for his job. He reports that the back pain does radiate to his bilateral legs that he describes as a shooting sensation. Pt states that this current pain does not feel like his kidney pain that he had in the past. Pt notes that when he bends over it feels like there is a bone moving. Pt has not been seen for his symptoms recently or been prescribed prednisone. Pt has not had a xray of his back since he was 34 years old due to a football injury where he was informed that he had scoliosis that he didn't follow up for. He states that he is having associated symptoms of low back pressure. He has tried heating pad and ibuprofen with no relief for his symptoms. Pt denies bowel/bladder incontinence or retention, hematuria, dysuria, numbness, tingling, weakness, and any other symptoms. Denies medical hx of CA or IV drug use.    History reviewed. No pertinent past medical history. No past surgical history on file. No family history on file. Social History  Substance Use Topics  . Smoking status: Current Every Day Smoker -- 0.50 packs/day    Types: Cigarettes    Last Attempt to Quit: 02/13/2012  . Smokeless tobacco: None  . Alcohol Use: Yes     Comment: rarely    Review of Systems  Constitutional: Negative for  fever, chills and diaphoresis.  Gastrointestinal:       No bowel incontinence  Genitourinary: Negative for dysuria and hematuria.       No bladder incontinence  Musculoskeletal: Positive for back pain.  Skin: Negative for color change, rash and wound.  Neurological: Negative for weakness and numbness.       No tingling      Allergies  Poison ivy treatments  Home Medications   Prior to Admission medications   Medication Sig Start Date End Date Taking? Authorizing Provider  chlorpheniramine-HYDROcodone (TUSSIONEX PENNKINETIC ER) 10-8 MG/5ML LQCR Take 5 mLs by mouth every 12 (twelve) hours. Patient not taking: Reported on 11/10/2014 01/13/14   Rolland Porter, MD  HYDROcodone-acetaminophen (NORCO/VICODIN) 5-325 MG per tablet Take 2 tablets by mouth every 4 (four) hours as needed. 08/23/15   Lilyonna Steidle Patel-Mills, PA-C  ibuprofen (ADVIL,MOTRIN) 800 MG tablet Take 1 tablet (800 mg total) by mouth 3 (three) times daily. 11/10/14   Eber Hong, MD  methocarbamol (ROBAXIN) 500 MG tablet Take 1 tablet (500 mg total) by mouth 2 (two) times daily. 08/23/15   Nobie Alleyne Patel-Mills, PA-C  naproxen (NAPROSYN) 500 MG tablet Take 1 tablet (500 mg total) by mouth 2 (two) times daily. 08/23/15   Telicia Hodgkiss Patel-Mills, PA-C  ondansetron (ZOFRAN ODT) 4 MG disintegrating tablet Take 1 tablet (4 mg total) by mouth every  8 (eight) hours as needed for nausea. Patient not taking: Reported on 11/10/2014 01/13/14   Rolland Porter, MD  ondansetron (ZOFRAN ODT) 4 MG disintegrating tablet Take 1 tablet (4 mg total) by mouth every 8 (eight) hours as needed for nausea. Patient not taking: Reported on 11/10/2014 01/13/14   Rolland Porter, MD  promethazine (PHENERGAN) 25 MG tablet Take 1 tablet (25 mg total) by mouth every 6 (six) hours as needed for nausea. 03/12/12 03/19/12  Earley Favor, NP  promethazine (PHENERGAN) 25 MG tablet Take 1 tablet (25 mg total) by mouth every 6 (six) hours as needed for nausea or vomiting. 11/10/14   Eber Hong, MD   BP  119/88 mmHg  Pulse 72  Temp(Src) 97.4 F (36.3 C) (Oral)  Resp 16  SpO2 96% Physical Exam  Constitutional: He is oriented to person, place, and time. He appears well-developed and well-nourished. No distress.  HENT:  Head: Normocephalic and atraumatic.  Eyes: EOM are normal.  Neck: Neck supple. No tracheal deviation present.  Cardiovascular: Normal rate.   Pulmonary/Chest: Effort normal. No respiratory distress.  Musculoskeletal: Normal range of motion.  Patient appears uncomfortable. Positive bilateral straight leg raise to 20. Tenderness to palpation of the lumbar paravertebral musculature but no midline tenderness. No CVA tenderness. No lower extremity weakness or numbness. No saddle anesthesia. Able to dorsi and plantar flex without difficulty. No foot drop.  Neurological: He is alert and oriented to person, place, and time.  Skin: Skin is warm and dry.  Psychiatric: He has a normal mood and affect. His behavior is normal.  Nursing note and vitals reviewed.   ED Course  Procedures (including critical care time) DIAGNOSTIC STUDIES: Oxygen Saturation is 96% on RA, nl by my interpretation.    COORDINATION OF CARE: 6:32 PM Discussed treatment plan with pt at bedside which includes imaging and pain medication and pt agreed to plan.    Labs Review Labs Reviewed - No data to display  Imaging Review Dg Lumbar Spine Complete  08/23/2015   CLINICAL DATA:  34 year old male with severe lower lumbar pain for several years. Pain extending down both legs.  EXAM: LUMBAR SPINE - COMPLETE 4+ VIEW  COMPARISON:  CT dated 11/10/2014  FINDINGS: There is no evidence of lumbar spine fracture. Alignment is normal. Intervertebral disc spaces are maintained.  IMPRESSION: Negative.   Electronically Signed   By: Elgie Collard M.D.   On: 08/23/2015 18:19     EKG Interpretation None      MDM   Final diagnoses:  Bilateral low back pain with left-sided sciatica  Patient presents for back pain  that is worse with movement. I do not suspect cauda equina syndrome or epidural abscess. His vital signs are stable. I believe this is most likely musculoskeletal related pain. I discussed return precautions with the patient and he verbally agrees with the plan. Medications  ketorolac (TORADOL) injection 60 mg (60 mg Intramuscular Given 08/23/15 1734)  diazepam (VALIUM) injection 5 mg (5 mg Intravenous Given 08/23/15 1734)   Rx: robaxin, percocet, naproxen  I personally performed the services described in this documentation, which was scribed in my presence. The recorded information has been reviewed and is accurate.    Catha Gosselin, PA-C 08/23/15 1837  Rolland Porter, MD 08/31/15 1721

## 2015-08-23 NOTE — Discharge Instructions (Signed)
Back Exercises Use the resource guide below to follow up with a primary care provider. Return for any bowel or bladder incontinence or retention, fever, weakness or numbness in your lower extremities. Back exercises help treat and prevent back injuries. The goal of back exercises is to increase the strength of your abdominal and back muscles and the flexibility of your back. These exercises should be started when you no longer have back pain. Back exercises include:  Pelvic Tilt. Lie on your back with your knees bent. Tilt your pelvis until the lower part of your back is against the floor. Hold this position 5 to 10 sec and repeat 5 to 10 times.  Knee to Chest. Pull first 1 knee up against your chest and hold for 20 to 30 seconds, repeat this with the other knee, and then both knees. This may be done with the other leg straight or bent, whichever feels better.  Sit-Ups or Curl-Ups. Bend your knees 90 degrees. Start with tilting your pelvis, and do a partial, slow sit-up, lifting your trunk only 30 to 45 degrees off the floor. Take at least 2 to 3 seconds for each sit-up. Do not do sit-ups with your knees out straight. If partial sit-ups are difficult, simply do the above but with only tightening your abdominal muscles and holding it as directed.  Hip-Lift. Lie on your back with your knees flexed 90 degrees. Push down with your feet and shoulders as you raise your hips a couple inches off the floor; hold for 10 seconds, repeat 5 to 10 times.  Back arches. Lie on your stomach, propping yourself up on bent elbows. Slowly press on your hands, causing an arch in your low back. Repeat 3 to 5 times. Any initial stiffness and discomfort should lessen with repetition over time.  Shoulder-Lifts. Lie face down with arms beside your body. Keep hips and torso pressed to floor as you slowly lift your head and shoulders off the floor. Do not overdo your exercises, especially in the beginning. Exercises may cause you  some mild back discomfort which lasts for a few minutes; however, if the pain is more severe, or lasts for more than 15 minutes, do not continue exercises until you see your caregiver. Improvement with exercise therapy for back problems is slow.  See your caregivers for assistance with developing a proper back exercise program. Document Released: 01/11/2005 Document Revised: 02/26/2012 Document Reviewed: 10/05/2011 [facility] Patient Information 2015 [place], [place]. This information is not intended to replace advice given to you by your health care provider. Make sure you discuss any questions you have with your health care provider.  Emergency Department Resource Guide 1) Find a Doctor and Pay Out of Pocket Although you won't have to find out who is covered by your insurance plan, it is a good idea to ask around and get recommendations. You will then need to call the office and see if the doctor you have chosen will accept you as a new patient and what types of options they offer for patients who are self-pay. Some doctors offer discounts or will set up payment plans for their patients who do not have insurance, but you will need to ask so you aren't surprised when you get to your appointment.  2) Contact Your Local Health Department Not all health departments have doctors that can see patients for sick visits, but many do, so it is worth a call to see if yours does. If you don't know where your local health  department is, you can check in your phone book. The CDC also has a tool to help you locate your state's health department, and many state websites also have listings of all of their local health departments.  3) Find a Walk-in Clinic If your illness is not likely to be very severe or complicated, you may want to try a walk in clinic. These are popping up all over the country in pharmacies, drugstores, and shopping centers. They're usually staffed by nurse practitioners or physician assistants that  have been trained to treat common illnesses and complaints. They're usually fairly quick and inexpensive. However, if you have serious medical issues or chronic medical problems, these are probably not your best option.  No Primary Care Doctor: - Call Health Connect at  [phone] - they can help you locate a primary care doctor that  accepts your insurance, provides certain services, etc. - Physician Referral Service- [phone]  Chronic Pain Problems: Organization         Address  Phone   Notes  [facility]  [phone] Patients need to be referred by their primary care doctor.   Medication Assistance: Organization         Address  Phone   Notes  [facility] Medication [facility] [address] [place] [place], [place] [address] [phone] --Must be a resident of [facility] -- Must have NO insurance coverage whatsoever (no Medicaid/ Medicare, etc.) -- The pt. MUST have a primary care doctor that directs their care regularly and follows them in the community   Med[facility]ist  [phone]   [employer]  [phone]    Agencies that provide inexpensive medical care: Organization         Address  Phone   Notes  [facility]  [phone]   [facility]    [phone]   [facility] [address] [place], [place] [address] [phone]   [facility] [address] [place] [place] [phone]   Planned Parenthood    [phone]   [facility]    [phone]   Community Health and [facility]  [address], [place] Phone:  [phone], Fax:  [phone] Hours of Operation:  9 am - 6 pm, M-F.  Also accepts Medicaid/Medicare and self-pay.  [facility]  [address], [place] Phone: [phone], Fax: [phone]. Hours of Operation:  8:30 am - 5:30 pm, M-F.  Also accepts Medicaid and  self-pay.  [facility] [place] [address] [place] Phone: [phone]   [facility] [address] Natasha Bence [place], [place] [phone], Ext. [phone] Mondays & Thursdays: 7-9 AM.  First 15 patients are seen on a first come, first serve basis.    Medicaid-accepting [facility] Providers:  Organization         Address  Phone   Notes  [facility] [address], [place] [phone] Also accepts self-pay patients.  [facility] [address] Laurell Josephs [place], [place]  [phone]   [facility] [address], [place] [phone]   [facility] [address] [place] [phone]   Renaye Rakers [address], [place]   [phone] Only accepts [place] Access [place] patients after they have  their name applied to their card.   Self-Pay (no insurance) in [facility]:  Organization         Address  Phone   Notes  Sickle Cell Patients, [facility] [place] [phone]   [facility] Urgent Care [place] [phone]   [facility] [place]  [place], [place], [place] [phone]   [facility]/Dr. Osei-Bonsu  [address], [place] or [address], [place] [phone] Phone number for both [place] and [place] locations is the same.  Urgent Medical and [facility] [address], [place] [phone]   [facility] [address] [place] or [address] [phone]   [facility] [address] [place] [phone], phone; [phone], fax Sees patients 1st and 3rd Saturday of every month.  Must not qualify for public or private insurance (i.e. Medicaid, Medicare, [place] Health Choice, Veterans' Benefits)  Household income  should be no more than 200% of the poverty level The clinic cannot treat you if you are pregnant or think you are pregnant  Sexually transmitted diseases are not treated at the clinic.    Dental Care: Organization         Address  Phone  Notes  [facility] Department of [facility] [place] [phone] Accepts children up to age 39 who are enrolled in [place] or [place]; pregnant women with a Medicaid card; and children who have applied for Medicaid or [place] Health Choice, but were declined, whose parents can pay a reduced fee at time of service.  [facility] Department of [facility]  [address], [place] [phone] Accepts children up to age 35 who are enrolled in [place] or [place]; pregnant women with a Medicaid card; and children who have applied for Medicaid or [place] Health Choice, but were declined, whose parents can pay a reduced fee at time of service.  [facility]  [place] [phone] Patients are seen by appointment only. Walk-ins are not accepted. [place] will see patients 24 years of age and older. Monday - Tuesday (8am-5pm) Most Wednesdays (8:30-5pm) $30 per visit, cash only  [facility] Adult Dental Access PROGRAM  [address], [facility] [phone] Patients are seen by appointment only. Walk-ins are not accepted. [place] will see patients 53 years of age and older. One Wednesday Evening (Monthly: Volunteer Based).  $30 per visit, cash only  [place]  [phone] for adults; Children under age 53, call Graduate Pediatric Dentistry at [phone]. Children aged 47-14, please call [phone] to request a pediatric application.  Dental services are provided in all areas of dental care including fillings, crowns and bridges, complete and partial dentures, implants, gum treatment,  root canals, and extractions. Preventive care is also provided. Treatment is provided to both adults and children. Patients are selected via a lottery and there is often a waiting list.   [facility] [address] [place]  [phone] [email or web address], [place], [place] [phone], Ext. [phone] Second and Fourth Thursday of each month, opens at 6:30 AM; Clinic ends at 9 AM.  Patients are seen on  a [employer] basis, and a limited number are seen during each clinic.   [facility]  [address] Hillard Danker [place], [place] [phone]   Eligibility Requirements You must have lived in [place], [place], or [place] counties for at least the last three months.   You cannot be eligible for state or federal sponsored [employer], including [employer], [place], or [employer].   You generally cannot be eligible for healthcare insurance through your employer.    How to apply: Eligibility screenings are held every Tuesday and Wednesday afternoon from 1:00 pm until 4:00 pm. You do not need an appointment for the interview!  [facility] [address] [place], [place]   [place]  [place] Department  [place]  [phone]    Behavioral Health Resources in the Community: Intensive Outpatient Programs Organization         Address  Phone  Notes  [place] [address] [place], [place] [phone]   [facility] [address], [place], [place]   ADS: Alcohol & Drug S[facility] [address], [place], [place]   [place] [address]  [place], [place] or [phone]   Substance Abuse Resources Organization         Address  Phone  Notes  Alcohol and Drug Services   [phone]   [place]  [phone]   [facility] [place]   Chinita Pester  [phone]   Residential & Outpatient Substance Abuse Program  [phone]   Psychological Services Organization         Address  Phone  Notes  [facility] [place]  [place]  [phone]   [place] [address] [place] or [phone]    Mobile Crisis Teams Organization         Address  Phone  Notes  [facility], Mobile Crisis Care Unit  [phone]   [facility]  [address] [place], [place]   [place] [address] [place] [phone]    Self-Help/Support Groups Organization         Address  Phone             Notes  [place] [facility] [place] - variety of support groups  [place] Call for more information  Narcotics Anonymous (NA), [facility] [address], [employer]  2 meetings at this location   Special educational needs teacher         Address  Phone  Notes  [facility] [place],    [place]  [phone]   [facility]  [address] [place] [phone], [place], [place]   [place], [place] [phone] Admissions: 8am-3pm M-F  Incentives Substance [place] [address],    [place], [place] [phone]   [facility] [address] Jadene Pierini [place], [place]   [facility] [address],  [place], [place]   [facility] Programs - Intensive Outpatient [address] Kristeen [address], [place], [place]   [facility] ([place].,  [place], [place] or [phone]   [facility] Services (RTS) [address], [place], [place]  Accepts Medicaid  [facility] [address],  [place] [phone] Substance Abuse/Addiction Treatment   [facility] Organization         Address  Phone  Notes  [facility]  [phone]   Angie Fava, PhD [address] Ervin Knack [place], [place]   [phone] or [phone]   [facility] Behavioral   [address] [place], [place] [phone]   [facility] [address] [place], [place] [phone] Insurance/Medicaid/sponsorship through [facility] and Families [address]                                    [place], [place] [phone] Therapy/tele-psych/case  [facility] [address] [place], [place] [phone]    Dr. Lolly Mustache  [phone]   [facility]  [employer] [facility] Dept. 1) [address], [place] 2) [address], [place] 3)  [address], [place] [phone]  [phone]   [facility] Child Abuse Hotline [phone] or [phone] (After Hours)

## 2016-06-13 ENCOUNTER — Emergency Department (HOSPITAL_COMMUNITY)
Admission: EM | Admit: 2016-06-13 | Discharge: 2016-06-13 | Disposition: A | Discharge status: Home / Self Care | Payer: 59 | Attending: Emergency Medicine | Admitting: Emergency Medicine

## 2016-06-13 ENCOUNTER — Emergency Department (HOSPITAL_COMMUNITY): Payer: 59

## 2016-06-13 ENCOUNTER — Encounter (HOSPITAL_COMMUNITY): Payer: Self-pay | Admitting: Emergency Medicine

## 2016-06-13 DIAGNOSIS — F17210 Nicotine dependence, cigarettes, uncomplicated: Secondary | ICD-10-CM | POA: Insufficient documentation

## 2016-06-13 DIAGNOSIS — R0789 Other chest pain: Secondary | ICD-10-CM | POA: Insufficient documentation

## 2016-06-13 DIAGNOSIS — R079 Chest pain, unspecified: Secondary | ICD-10-CM | POA: Diagnosis present

## 2016-06-13 DIAGNOSIS — Z7982 Long term (current) use of aspirin: Secondary | ICD-10-CM | POA: Insufficient documentation

## 2016-06-13 LAB — CBC
HEMATOCRIT: 41.1 % (ref 39.0–52.0)
HEMOGLOBIN: 15.1 g/dL (ref 13.0–17.0)
MCH: 31.6 pg (ref 26.0–34.0)
MCHC: 36.7 g/dL — AB (ref 30.0–36.0)
MCV: 86 fL (ref 78.0–100.0)
Platelets: 238 10*3/uL (ref 150–400)
RBC: 4.78 MIL/uL (ref 4.22–5.81)
RDW: 12.4 % (ref 11.5–15.5)
WBC: 7.2 10*3/uL (ref 4.0–10.5)

## 2016-06-13 LAB — BASIC METABOLIC PANEL
ANION GAP: 7 (ref 5–15)
BUN: 13 mg/dL (ref 6–20)
CHLORIDE: 105 mmol/L (ref 101–111)
CO2: 26 mmol/L (ref 22–32)
Calcium: 9.5 mg/dL (ref 8.9–10.3)
Creatinine, Ser: 0.72 mg/dL (ref 0.61–1.24)
GFR calc non Af Amer: >60 mL/min (ref >60)
Glucose, Bld: 108 mg/dL — ABNORMAL HIGH (ref 65–99)
Potassium: 3.7 mmol/L (ref 3.5–5.1)
Sodium: 138 mmol/L (ref 135–145)

## 2016-06-13 LAB — I-STAT TROPONIN, ED: TROPONIN I, POC: 0 ng/mL (ref 0.00–0.08)

## 2016-06-13 MED ORDER — KETOROLAC TROMETHAMINE 30 MG/ML IJ SOLN
30.0000 mg | Freq: Once | INTRAMUSCULAR | Status: AC
  Administered 2016-06-13: 30 mg via INTRAMUSCULAR
  Filled 2016-06-13: qty 1

## 2016-06-13 MED ORDER — NAPROXEN 500 MG PO TABS: 500.0000 mg | ORAL_TABLET | Freq: Two times a day (BID) | ORAL | Status: AC

## 2016-06-13 NOTE — ED Notes (Addendum)
Patient here with complaints of right sided chest pain non-radiating since Saturday. Increased with sneezing or coughing. Denies SOB or dizziness. Denies n/v/d.

## 2016-06-13 NOTE — Discharge Instructions (Signed)
There does not appear to be an emergent cause for your symptoms at this time. Your exam, labs, x-ray and EKG are all reassuring. Please take your medications as prescribed. Follow-up with your doctor or the community health and wellness Center to establish primary care for reevaluation. Return to ED for any new or worsening symptoms.  Chest Wall Pain Chest wall pain is pain in or around the bones and muscles of your chest. Sometimes, an injury causes this pain. Sometimes, the cause may not be known. This pain may take several weeks or longer to get better. HOME CARE INSTRUCTIONS  Pay attention to any changes in your symptoms. Take these actions to help with your pain:   Rest as told by your health care provider.   Avoid activities that cause pain. These include any activities that use your chest muscles or your abdominal and side muscles to lift heavy items.   If directed, apply ice to the painful area:  Put ice in a plastic bag.  Place a towel between your skin and the bag.  Leave the ice on for 20 minutes, 2-3 times per day.  Take over-the-counter and prescription medicines only as told by your health care provider.  Do not use tobacco products, including cigarettes, chewing tobacco, and e-cigarettes. If you need help quitting, ask your health care provider.  Keep all follow-up visits as told by your health care provider. This is important. SEEK MEDICAL CARE IF:  You have a fever.  Your chest pain becomes worse.  You have new symptoms. SEEK IMMEDIATE MEDICAL CARE IF:  You have nausea or vomiting.  You feel sweaty or light-headed.  You have a cough with phlegm (sputum) or you cough up blood.  You develop shortness of breath.   This information is not intended to replace advice given to you by your health care provider. Make sure you discuss any questions you have with your health care provider.   Document Released: 12/04/2005 Document Revised: 08/25/2015 Document  Reviewed: 03/01/2015 Elsevier Interactive Patient Education [employer]2016 Elsevier Inc.

## 2016-06-13 NOTE — ED Provider Notes (Signed)
CSN: [id number]     Arrival date & time 06/13/16  1257 History   First MD Initiated Contact with Patient 06/13/16 1351     Chief Complaint  Patient presents with  . Chest Pain     (Consider location/radiation/quality/duration/timing/severity/associated sxs/prior Treatment) HPI Joseph Jefferson is a 35 y.o. male with 30 pack year smoking history here for evaluation of sudden onset, right-sided chest pain started Saturday. Patient reports he sneezed really hard and experienced sudden right-sided chest discomfort that is sharp in nature. Worse when he breathes deeply, coughs, yawns or hiccups. He is tried ice and icy hot without relief of his symptoms. He has not tried any other medications. He denies any shortness of breath, nausea or vomiting, diaphoresis, leg swelling, hemoptysis. No other modifying factors. He does note that he quit smoking roughly 8 months ago and has been running since that time and has not experienced any chest discomfort.  History reviewed. No pertinent past medical history. History reviewed. No pertinent past surgical history. History reviewed. No pertinent family history. Social History  Substance Use Topics  . Smoking status: Current Every Day Smoker -- 0.50 packs/day    Types: Cigarettes    Last Attempt to Quit: 02/13/2012  . Smokeless tobacco: None  . Alcohol Use: Yes     Comment: rarely    Review of Systems A 10 point review of systems was completed and was negative except for pertinent positives and negatives as mentioned in the history of present illness     Allergies  Poison ivy treatments  Home Medications   Prior to Admission medications   Medication Sig Start Date End Date Taking? Authorizing Provider  Aspirin-Acetaminophen-Caffeine (GOODYS EXTRA STRENGTH) [id number] MG PACK Take 1 packet by mouth once.   Yes Historical Provider, MD  Multiple Vitamin (MULTIVITAMIN WITH MINERALS) TABS tablet Take 1 tablet by mouth daily.   Yes Historical  Provider, MD  naproxen (NAPROSYN) 500 MG tablet Take 1 tablet (500 mg total) by mouth 2 (two) times daily. 06/13/16   Joycie PeekBenjamin Alexa Golebiewski, PA-C   BP 127/93 mmHg  Pulse 92  Temp(Src) 98.5 F (36.9 C) (Oral)  Resp 18  SpO2 100% Physical Exam  Constitutional: He is oriented to person, place, and time. He appears well-developed and well-nourished.  HENT:  Head: Normocephalic and atraumatic.  Mouth/Throat: Oropharynx is clear and moist.  Eyes: Conjunctivae are normal. Pupils are equal, round, and reactive to light. Right eye exhibits no discharge. Left eye exhibits no discharge. No scleral icterus.  Neck: Neck supple.  Cardiovascular: Normal rate, regular rhythm and normal heart sounds.   Pulmonary/Chest: Effort normal and breath sounds normal. No respiratory distress. He has no wheezes. He has no rales. He exhibits tenderness.  Discomfort is exacerbated and replicated with palpation of the right anterior chest wall, posterior ribs and right axillary ribs. No crepitus, tenting, rash or other abnormal findings.  Abdominal: Soft. There is no tenderness.  Musculoskeletal: He exhibits no tenderness.  Neurological: He is alert and oriented to person, place, and time.  Cranial Nerves II-XII grossly intact  Skin: Skin is warm and dry. No rash noted.  Psychiatric: He has a normal mood and affect.  Nursing note and vitals reviewed.   ED Course  Procedures (including critical care time) Labs Review Labs Reviewed  BASIC METABOLIC PANEL - Abnormal; Notable for the following:    Glucose, Bld 108 (*)    All other components within normal limits  CBC - Abnormal; Notable for the following:  MCHC 36.7 (*)    All other components within normal limits  [place] SensorI-STAT TROPOININ, ED    Imaging Review Dg Chest 2 View  06/13/2016  CLINICAL DATA:  Right-sided chest pain for 3 days EXAM: CHEST  2 VIEW COMPARISON:  January 13, 2014 FINDINGS: There is no edema or consolidation. The heart size and pulmonary  vascularity are normal. No pneumothorax. No adenopathy. No bone lesions. IMPRESSION: No edema or consolidation. Electronically Signed   By: Bretta BangWilliam  Woodruff III M.D.   On: 06/13/2016 13:29   I have personally reviewed and evaluated these images and lab results as part of my medical decision-making.   EKG Interpretation   Date/Time:  Tuesday June 13 2016 13:04:15 EDT Ventricular Rate:  89 PR Interval:    QRS Duration: 90 QT Interval:  341 QTC Calculation: 415 R Axis:   64 Text Interpretation:  Sinus rhythm RSR' in V1 or V2, probably normal  variant ST elev, probable normal early repol pattern Confirmed by Lincoln Brighamees,  Liz [phone]([id number]) on 06/13/2016 1:07:42 PM Also confirmed by Lincoln Brighamees, Liz [phone]([id number]),  editor Dan HumphreysWALKER, CCT, SANDRA [phone]([id number])  on 06/13/2016 1:37:14 PM      MDM  Patient with atypical chest pain for ACS, most consistent with MSK etiology. Arrival, he is hemodynamically stable, afebrile and appears well. Discomfort is replicated with palpation of chest wall. Initial troponin, screening labs, chest x-ray are all reassuring. EKG also reassuring. Heart Score 2. PERC neg. Treated discomfort and emergency department with Toradol, will DC with naproxen and instructions to follow-up with PCP. Final diagnoses:  Right-sided chest wall pain        Joycie PeekBenjamin Zaineb Nowaczyk, PA-C 06/13/16 1557  Arby BarretteMarcy Pfeiffer, MD 06/17/16 1050

## 2016-12-19 IMAGING — CR DG CHEST 2V
2 series · 2 of 2 positions shown · non-contrast
Comparison: January 13, 2014

CLINICAL DATA: Right-sided chest pain for 3 days

EXAM:
CHEST  2 VIEW

[w chest pa]
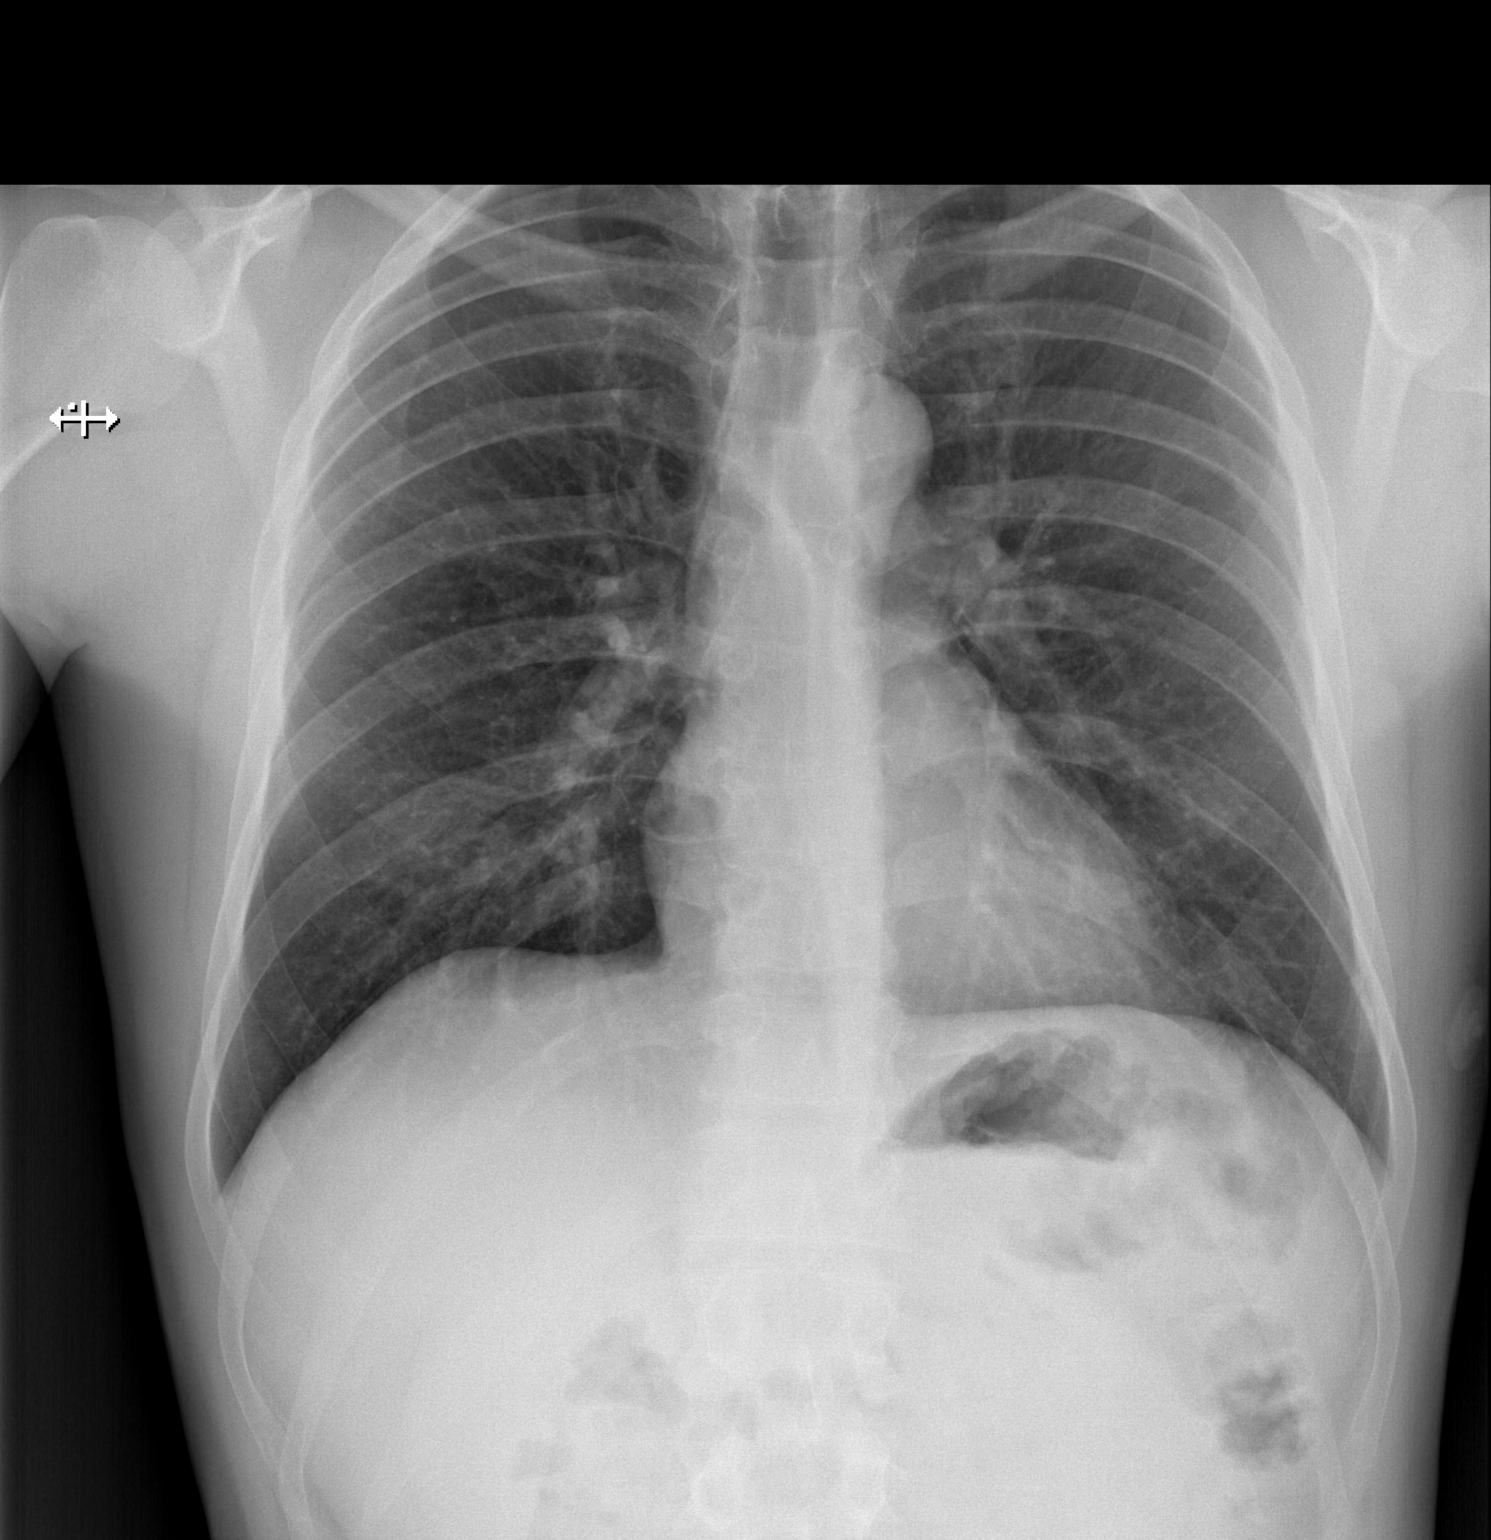

[w chest lat]
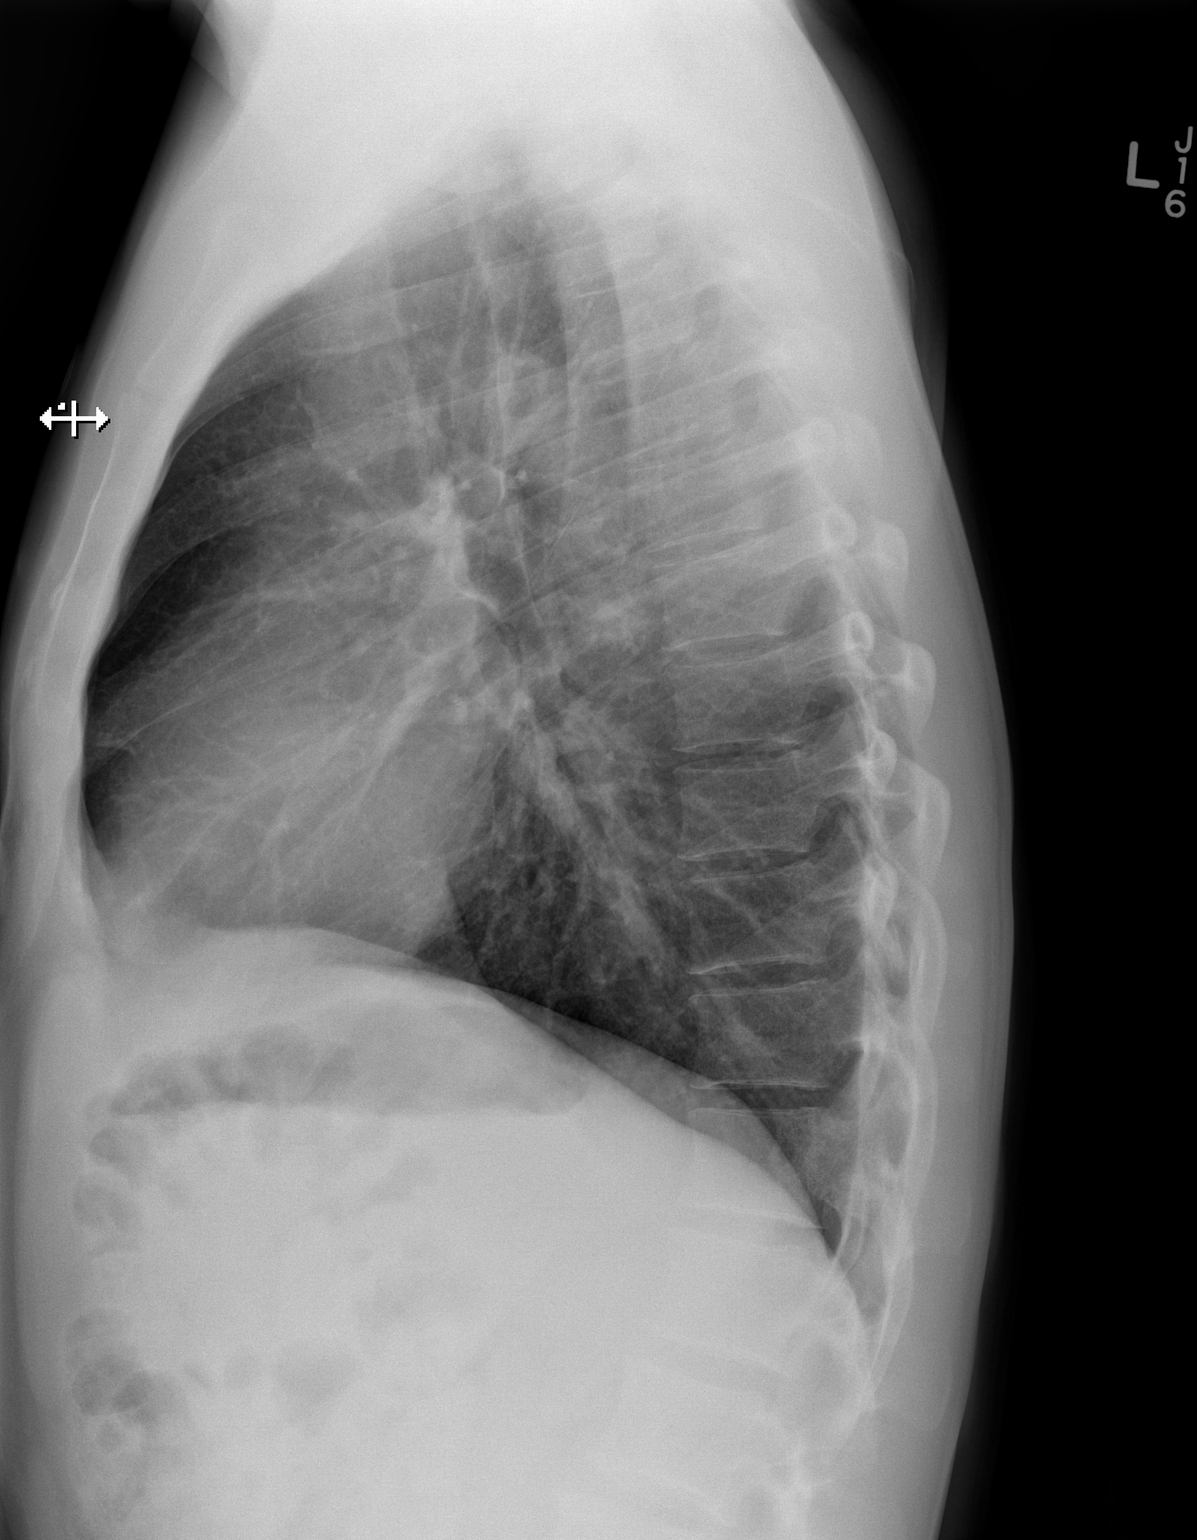

[2 of 2 positions shown; findings below may reference images not displayed]

FINDINGS: There is no edema or consolidation. The heart size and pulmonary
vascularity are normal. No pneumothorax. No adenopathy. No bone
lesions.
IMPRESSION: No edema or consolidation.

## 2022-12-06 ENCOUNTER — Emergency Department (HOSPITAL_COMMUNITY)
Admission: EM | Admit: 2022-12-06 | Discharge: 2022-12-06 | Disposition: A | Discharge status: Home / Self Care | Payer: No Typology Code available for payment source | Attending: Emergency Medicine | Admitting: Emergency Medicine

## 2022-12-06 ENCOUNTER — Encounter (HOSPITAL_COMMUNITY): Payer: Self-pay

## 2022-12-06 DIAGNOSIS — U071 COVID-19: Secondary | ICD-10-CM | POA: Insufficient documentation

## 2022-12-06 DIAGNOSIS — M791 Myalgia, unspecified site: Secondary | ICD-10-CM | POA: Diagnosis present

## 2022-12-06 DIAGNOSIS — Z7982 Long term (current) use of aspirin: Secondary | ICD-10-CM | POA: Diagnosis not present

## 2022-12-06 DIAGNOSIS — R52 Pain, unspecified: Secondary | ICD-10-CM

## 2022-12-06 MED ORDER — ONDANSETRON HCL 4 MG PO TABS: 4.0000 mg | ORAL_TABLET | Freq: Four times a day (QID) | ORAL | 0 refills | Status: AC

## 2022-12-06 NOTE — ED Triage Notes (Signed)
Pt arrived via POV, c/o bodyaches. Tested positive for COVID yesterday.

## 2022-12-06 NOTE — Discharge Instructions (Signed)
Please use Tylenol or ibuprofen for pain.  You may use 600 mg ibuprofen every 6 hours or 1000 mg of Tylenol every 6 hours.  You may choose to alternate between the 2.  This would be most effective.  Not to exceed 4 g of Tylenol within 24 hours.  Not to exceed 3200 mg ibuprofen 24 hours.  

## 2022-12-06 NOTE — ED Provider Notes (Signed)
[facility]-EMERGENCY DEPT Provider Note   CSN: [id number] Arrival date & time: 12/06/22  1528     History  Chief Complaint  Patient presents with   Generalized Body Aches    Joseph Jefferson is a 41 y.o. male with no significant past medical history who presents with concern for body aches, nausea, vomiting, tested positive for COVID yesterday.  Patient denies any previous past medical history.  He denies any chest pain, shortness of breath.  HPI     Home Medications Prior to Admission medications   Medication Sig Start Date End Date Taking? Authorizing Provider  ondansetron (ZOFRAN) 4 MG tablet Take 1 tablet (4 mg total) by mouth every 6 (six) hours. 12/06/22  Yes Pernie Grosso H, PA-C  Aspirin-Acetaminophen-Caffeine (GOODYS EXTRA STRENGTH) [phone] MG PACK Take 1 packet by mouth once.    [provider]  Multiple Vitamin (MULTIVITAMIN WITH MINERALS) TABS tablet Take 1 tablet by mouth daily.    [provider]  naproxen (NAPROSYN) 500 MG tablet Take 1 tablet (500 mg total) by mouth 2 (two) times daily. 06/13/16   Joycie Peek, PA-C      Allergies    Poison ivy treatments [ivy-rid]    Review of Systems   Review of Systems  Respiratory:  Positive for cough.   Musculoskeletal:  Positive for myalgias.  All other systems reviewed and are negative.   Physical Exam Updated Vital Signs BP 104/69   Pulse 85   Temp 98.1 F (36.7 C) (Oral)   Resp 16   SpO2 95%  Physical Exam Vitals and nursing note reviewed.  Constitutional:      General: He is not in acute distress.    Appearance: Normal appearance.  HENT:     Head: Normocephalic and atraumatic.     Mouth/Throat:     Comments: No significant posterior oropharynx erythema, swelling, exudate. Uvula midline, tonsils 1+ bilaterally.  No trismus, stridor, evidence of PTA, floor of mouth swelling or redness.   Eyes:     General:        Right eye: No discharge.         Left eye: No discharge.  Cardiovascular:     Rate and Rhythm: Normal rate and regular rhythm.  Pulmonary:     Effort: Pulmonary effort is normal. No respiratory distress.     Comments: No wheezing, stridor, rhonchi, rales Musculoskeletal:        General: No deformity.  Skin:    General: Skin is warm and dry.  Neurological:     Mental Status: He is alert and oriented to person, place, and time.  Psychiatric:        Mood and Affect: Mood normal.        Behavior: Behavior normal.     ED Results / Procedures / Treatments   Labs (all labs ordered are listed, but only abnormal results are displayed) Labs Reviewed - No data to display  EKG None  Radiology No results found.  Procedures Procedures    Medications Ordered in ED Medications - No data to display  ED Course/ Medical Decision Making/ A&P                           Medical Decision Making Risk Prescription drug management.   This is a well-appearing male who presents with concern for 2 days of cough, nausea, vomiting, myalgias.  My emergent differential diagnosis includes acute upper respiratory infection with  COVID, flu, RSV versus new asthma presentation, acute bronchitis, less clinical concern for pneumonia.  Also considered other ENT emergencies, Ludwig angina, strep pharyngitis, mono, versus epiglottis, tonsillitis versus other.  This is not an exhaustive differential.  On my exam patient is overall well-appearing, and afebrile. breathing unlabored, no tachypnea, no respiratory distress, stable oxygen saturation.  Patient without tachycardia.  Bilateral TMs are clear.  Patient reports positive home test for COVID, which is a reasonable explanation of his symptoms. Encouraged ibuprofen, Tylenol, rest, plenty of fluids.  Discussed extensive return precautions.  Patient discharged in stable condition at this time.  Prescription for Zofran provided for any return of nausea, vomiting.  Final Clinical Impression(s) / ED  Diagnoses Final diagnoses:  COVID  Body aches    Rx / DC Orders ED Discharge Orders          Ordered    ondansetron (ZOFRAN) 4 MG tablet  Every 6 hours        12/06/22 1614              Rayma Hegg, [place], PA-C 12/06/22 1632    Jacalyn Lefevre, MD 12/06/22 1740
